# Patient Record
Sex: Male | Born: 1985 | Race: White | Hispanic: No | Marital: Single | Smoking: Heavy tobacco smoker
Health system: Southern US, Community
[De-identification: ages and names within clinical notes are randomized; demographics above are authoritative.]

## PROBLEM LIST (undated history)

## (undated) DIAGNOSIS — F1110 Opioid abuse, uncomplicated: Secondary | ICD-10-CM

---
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
## 2007-05-25 ENCOUNTER — Emergency Department (HOSPITAL_COMMUNITY): Admission: EM | Admit: 2007-05-25 | Discharge: 2007-05-25 | Payer: Self-pay | Admitting: Emergency Medicine

## 2009-02-08 ENCOUNTER — Emergency Department (HOSPITAL_COMMUNITY): Admission: EM | Admit: 2009-02-08 | Discharge: 2009-02-08 | Payer: Self-pay | Admitting: Emergency Medicine

## 2010-05-07 ENCOUNTER — Emergency Department (HOSPITAL_COMMUNITY): Admission: EM | Admit: 2010-05-07 | Discharge: 2010-05-07 | Payer: Self-pay | Admitting: Emergency Medicine

## 2010-05-29 ENCOUNTER — Emergency Department (HOSPITAL_COMMUNITY)
Admission: EM | Admit: 2010-05-29 | Discharge: 2010-05-30 | Disposition: A | Discharge status: Other Acute Inpatient Hospital | Payer: Self-pay | Source: Home / Self Care | Admitting: Emergency Medicine

## 2010-05-30 ENCOUNTER — Inpatient Hospital Stay (HOSPITAL_COMMUNITY)
Admission: AD | Admit: 2010-05-30 | Discharge: 2010-06-02 | Payer: Self-pay | Attending: Psychiatry | Admitting: Psychiatry

## 2010-08-23 LAB — DIFFERENTIAL
Basophils Absolute: 0 10*3/uL (ref 0.0–0.1)
Eosinophils Absolute: 0.3 10*3/uL (ref 0.0–0.7)
Lymphocytes Relative: 24 % (ref 12–46)
Monocytes Absolute: 0.9 10*3/uL (ref 0.1–1.0)
Monocytes Relative: 9 % (ref 3–12)

## 2010-08-23 LAB — BASIC METABOLIC PANEL
CO2: 29 mEq/L (ref 19–32)
Calcium: 9 mg/dL (ref 8.4–10.5)
Creatinine, Ser: 1.32 mg/dL (ref 0.4–1.5)
GFR calc non Af Amer: >60 mL/min (ref >60)
Glucose, Bld: 118 mg/dL — ABNORMAL HIGH (ref 70–99)
Sodium: 142 mEq/L (ref 135–145)

## 2010-08-23 LAB — RAPID URINE DRUG SCREEN, HOSP PERFORMED
Amphetamines: NOT DETECTED
Barbiturates: NOT DETECTED
Opiates: POSITIVE — AB
Tetrahydrocannabinol: POSITIVE — AB

## 2010-08-23 LAB — CBC
MCH: 31.9 pg (ref 26.0–34.0)
Platelets: 224 10*3/uL (ref 150–400)
RBC: 4.58 MIL/uL (ref 4.22–5.81)
WBC: 10 10*3/uL (ref 4.0–10.5)

## 2011-03-21 LAB — CBC
MCV: 92.3
RBC: 5.28
WBC: 17.4 — ABNORMAL HIGH

## 2011-03-21 LAB — URINALYSIS, ROUTINE W REFLEX MICROSCOPIC
Glucose, UA: NEGATIVE
Protein, ur: NEGATIVE
pH: 5.5

## 2011-03-21 LAB — URINE MICROSCOPIC-ADD ON

## 2011-03-21 LAB — DIFFERENTIAL
Basophils Relative: 0
Eosinophils Absolute: 0.1 — ABNORMAL LOW
Lymphs Abs: 2.5
Monocytes Absolute: 1.3 — ABNORMAL HIGH
Monocytes Relative: 7
Neutrophils Relative %: 78 — ABNORMAL HIGH

## 2011-03-21 LAB — BASIC METABOLIC PANEL
Potassium: 3.6
Sodium: 137

## 2011-03-21 LAB — RAPID URINE DRUG SCREEN, HOSP PERFORMED
Cocaine: NOT DETECTED
Tetrahydrocannabinol: POSITIVE — AB

## 2015-01-19 ENCOUNTER — Emergency Department (HOSPITAL_COMMUNITY): Payer: Self-pay

## 2015-01-19 ENCOUNTER — Emergency Department (HOSPITAL_COMMUNITY)
Admission: EM | Admit: 2015-01-19 | Discharge: 2015-01-19 | Disposition: A | Discharge status: Home / Self Care | Payer: Self-pay | Attending: Physician Assistant | Admitting: Physician Assistant

## 2015-01-19 ENCOUNTER — Encounter (HOSPITAL_COMMUNITY): Payer: Self-pay | Admitting: Emergency Medicine

## 2015-01-19 DIAGNOSIS — L259 Unspecified contact dermatitis, unspecified cause: Secondary | ICD-10-CM | POA: Insufficient documentation

## 2015-01-19 DIAGNOSIS — L309 Dermatitis, unspecified: Secondary | ICD-10-CM

## 2015-01-19 DIAGNOSIS — Z720 Tobacco use: Secondary | ICD-10-CM | POA: Insufficient documentation

## 2015-01-19 MED ORDER — LORATADINE 10 MG PO TABS: 10.0000 mg | ORAL_TABLET | Freq: Every day | ORAL | Status: DC

## 2015-01-19 MED ORDER — PREDNISONE 10 MG PO TABS: 20.0000 mg | ORAL_TABLET | Freq: Two times a day (BID) | ORAL | Status: DC

## 2015-01-19 MED ORDER — HYDROXYZINE HCL 25 MG PO TABS: 25.0000 mg | ORAL_TABLET | Freq: Four times a day (QID) | ORAL | Status: DC

## 2015-01-19 MED ORDER — FAMOTIDINE 20 MG PO TABS: 20.0000 mg | ORAL_TABLET | Freq: Two times a day (BID) | ORAL | Status: DC

## 2015-01-19 NOTE — Discharge Instructions (Signed)
Follow up with Dr. Margo Aye if symptoms persist. When you call his number ask for an appointment in the [place] office.

## 2015-01-19 NOTE — ED Notes (Signed)
Pt states that for past 3 weeks has been dealing with a rash--- States that it is all around his groin area

## 2015-01-19 NOTE — ED Provider Notes (Signed)
CSN: [id number]     Arrival date & time 01/19/15  1158 History  This chart was scribed for non-physician practitioner, Kerrie Buffalo, NP, working with Abelino Derrick, MD, by Ronney Lion, ED Scribe. This patient was seen in room APFT21/APFT21 and the patient's care was started at 1:45 PM.    Chief Complaint  Patient presents with  . Rash   Patient is a 29 y.o. male presenting with rash. The history is provided by the patient. No language interpreter was used.  Rash Location:  Ano-genital Ano-genital rash location:  Groin, penis and scrotum Quality: itchiness and painful   Pain details:    Severity:  Moderate   Onset quality:  Gradual   Duration:  3 weeks   Timing:  Constant   Progression:  Worsening (due to scratching) Severity:  Moderate Onset quality:  Gradual Duration:  3 weeks Timing:  Constant Progression:  Worsening Chronicity:  Recurrent Context: plant contact   Relieved by:  None tried Worsened by:  Nothing tried Ineffective treatments:  Anti-itch cream, topical steroids and OTC analgesics  HPI Comments: Wesley Stephens is a 29 y.o. male who presents to the Emergency Department complaining of a constant, pruritic, painful rash on his groin that began 3 weeks ago after coming into contact with poison oak at work. 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 He reports doing yard work and having his hand on poison Ivy and then having to use the bathroom outside. So he could have gotten pollen from his hands on his genital area causing the rash.  Patient has been applying calamine and hydrocortisone to the area, and used ibuprofen with no relief. He denies any recent changes in detergent, soaps, or cosmetics. He denies rash on any other area of his body. Patient states he first had a rash on his leg 4 years ago due  to poison oak that re-appears every year in the summer. He had a rash on his leg this year that was resolved after applying Clorox to dry up the rash. He denies taking any Benadryl.  History reviewed. No pertinent past medical history. History reviewed. No pertinent past surgical history. History reviewed. No pertinent family history. Social History  Substance Use Topics  . Smoking status: Light Tobacco Smoker  . Smokeless tobacco: Never Used  . Alcohol Use: No    Review of Systems  Skin: Positive for rash.  All other systems reviewed and are negative.  Allergies  Review of patient's allergies indicates no known allergies.  Home Medications   Prior to Admission medications   Medication Sig Start Date End Date Taking? Authorizing Provider  famotidine (PEPCID) 20 MG tablet Take 1 tablet (20 mg total) by mouth 2 (two) times daily. 01/19/15   Charl Wellen Orlene Och, NP  hydrOXYzine (ATARAX/VISTARIL) 25 MG tablet Take 1 tablet (25 mg total) by mouth every 6 (six) hours. 01/19/15   Ladaija Dimino Orlene Och, NP  loratadine (CLARITIN) 10 MG tablet Take 1 tablet (10 mg total) by mouth daily. 01/19/15   Shahzain Kiester Orlene Och, NP  predniSONE (DELTASONE) 10 MG tablet Take 2 tablets (20 mg total) by mouth 2 (two) times daily with a meal. 01/19/15   Iyan Flett Orlene Och, NP   BP 114/64 mmHg  Pulse 64  Temp(Src) 97.7 F (36.5 C) (Oral)  Resp 16  Ht  5' 10.5" (1.791 m)  Wt 185 lb (83.915 kg)  BMI 26.16 kg/m2  SpO2 100% Physical Exam  Constitutional: He is oriented to person, place, and time. He appears well-developed and well-nourished. No distress.  HENT:  Head: Normocephalic and atraumatic.  Eyes: Conjunctivae and EOM are normal.  Neck: Neck supple. No tracheal deviation present.  Cardiovascular: Normal rate.   Pulmonary/Chest: Effort normal. No respiratory distress.  Abdominal:  Small red raised areas noted to the the pubic area. No red streaking or signs of infection.   Genitourinary:  Head of penis with rash and itching.    Musculoskeletal: Normal range of motion.  Neurological: He is alert and oriented to person, place, and time.  Skin: Skin is warm and dry.  Psychiatric: He has a normal mood and affect. His behavior is normal.  Nursing note and vitals reviewed.   ED Course  Procedures (including critical care time)  DIAGNOSTIC STUDIES: Oxygen Saturation is 100% on RA, normal by my interpretation.    COORDINATION OF CARE: 1:51 PM - Suspect allergic reaction. Discussed treatment plan with pt at bedside which includes Rx steroid medications, and pt agreed to plan.   MDM  29 y.o. male with rash and itching after working in poison ivy while doing yard work. Will treat with Prednisone, Claritin and Atarax. He will follow up with dermatology if symptoms persist. Stable for d/c without swelling or signs of infection.   Final diagnoses:  Dermatitis    I personally performed the services described in this documentation, which was scribed in my presence. The recorded information has been reviewed and is accurate.     [facility] Orlene Och, NP 01/21/15 1423  Courteney Randall An, MD 01/24/15 [phone]

## 2015-11-09 ENCOUNTER — Emergency Department (HOSPITAL_COMMUNITY)
Admission: EM | Admit: 2015-11-09 | Discharge: 2015-11-09 | Disposition: A | Discharge status: Home / Self Care | Payer: Self-pay | Attending: Emergency Medicine | Admitting: Emergency Medicine

## 2015-11-09 ENCOUNTER — Encounter (HOSPITAL_COMMUNITY): Payer: Self-pay | Admitting: Emergency Medicine

## 2015-11-09 DIAGNOSIS — F17210 Nicotine dependence, cigarettes, uncomplicated: Secondary | ICD-10-CM | POA: Insufficient documentation

## 2015-11-09 DIAGNOSIS — F1110 Opioid abuse, uncomplicated: Secondary | ICD-10-CM | POA: Insufficient documentation

## 2015-11-09 HISTORY — DX: Opioid abuse, uncomplicated: F11.10

## 2015-11-09 LAB — BASIC METABOLIC PANEL
Anion gap: 6 (ref 5–15)
BUN: 14 mg/dL (ref 6–20)
CO2: 24 mmol/L (ref 22–32)
Calcium: 9 mg/dL (ref 8.9–10.3)
Chloride: 107 mmol/L (ref 101–111)
Creatinine, Ser: 0.97 mg/dL (ref 0.61–1.24)
GFR calc Af Amer: >60 mL/min (ref >60)
GFR calc non Af Amer: >60 mL/min (ref >60)
Glucose, Bld: 111 mg/dL — ABNORMAL HIGH (ref 65–99)
Potassium: 3.7 mmol/L (ref 3.5–5.1)
Sodium: 137 mmol/L (ref 135–145)

## 2015-11-09 LAB — ETHANOL: Alcohol, Ethyl (B): <5 mg/dL (ref <5)

## 2015-11-09 LAB — CBC WITH DIFFERENTIAL/PLATELET
BASOS ABS: 0.1 10*3/uL (ref 0.0–0.1)
BASOS PCT: 1 %
EOS ABS: 0.1 10*3/uL (ref 0.0–0.7)
EOS PCT: 2 %
HCT: 42.4 % (ref 39.0–52.0)
Hemoglobin: 14.5 g/dL (ref 13.0–17.0)
Lymphocytes Relative: 19 %
Lymphs Abs: 1.3 10*3/uL (ref 0.7–4.0)
MCH: 30.3 pg (ref 26.0–34.0)
MCHC: 34.2 g/dL (ref 30.0–36.0)
MCV: 88.7 fL (ref 78.0–100.0)
MONO ABS: 0.7 10*3/uL (ref 0.1–1.0)
MONOS PCT: 10 %
Neutro Abs: 4.7 10*3/uL (ref 1.7–7.7)
Neutrophils Relative %: 69 %
PLATELETS: 241 10*3/uL (ref 150–400)
RBC: 4.78 MIL/uL (ref 4.22–5.81)
RDW: 12.3 % (ref 11.5–15.5)
WBC: 6.8 10*3/uL (ref 4.0–10.5)

## 2015-11-09 LAB — RAPID URINE DRUG SCREEN, HOSP PERFORMED
Amphetamines: NOT DETECTED
Barbiturates: NOT DETECTED
Benzodiazepines: POSITIVE — AB
Cocaine: NOT DETECTED
Opiates: NOT DETECTED
Tetrahydrocannabinol: POSITIVE — AB

## 2015-11-09 LAB — URINALYSIS, ROUTINE W REFLEX MICROSCOPIC
Bilirubin Urine: NEGATIVE
Glucose, UA: NEGATIVE mg/dL
Hgb urine dipstick: NEGATIVE
Ketones, ur: NEGATIVE mg/dL
Leukocytes, UA: NEGATIVE
Nitrite: NEGATIVE
Protein, ur: NEGATIVE mg/dL
Specific Gravity, Urine: 1.02 (ref 1.005–1.030)
pH: 6 (ref 5.0–8.0)

## 2015-11-09 LAB — ACETAMINOPHEN LEVEL: Acetaminophen (Tylenol), Serum: <10 ug/mL — ABNORMAL LOW (ref 10–30)

## 2015-11-09 NOTE — Discharge Instructions (Signed)
Community Resource Guide Outpatient Counseling/Substance Abuse Adult °The [employer]’s “211” is a great source of information about community services available.  Access by dialing 2-1-1 from anywhere in [place], or by website -  www.nc211.org.  ° °Other Local Resources (Updated 06/2015) ° °Crisis Hotlines °  °Services  ° °  °Area Served  °[facility] • Crisis Hotline, available 24 hours a day, 7 days a week: 800-939-5911 [place], [place]  ° [facility] • Crisis Hotline, available 24 hours a day, 7 days a week: 866-275-9552 [place], [place]  °[facility] • Suicide Prevention Hotline, available 24 hours a day, 7 days a week: 800-273-8255 [place], [place]  °[facility] ° • Crisis Hotline, available 24 hours a day, 7 days a week: 336-676-6840 [place], [place] °  °[facility] • Crisis Hotline, available 24 hours a day, 7 days a week: 800-256-2452 All °  °[facility] • Crisis Hotline, available 24 hours a day, 7 days a week: 877-626-1772 [place]  ° °Other Local Resources (Updated 06/2015) ° °Outpatient Counseling/ Substance Abuse Programs  °Services  ° °  °Address and Phone Number  °[facility] ° • Options include Individual counseling, group counseling, intensive outpatient program (several hours a day, several days a week) °• Offers depression assessments °• Provides methadone maintenance program 336-333-6860 °[address] °[place], [place] [address] °  °[facility] ° • Offers partial hospitalization/day treatment and DUI/DWI programs °• Accepts Medicare, private insurance 336-299-4655 °[address] °[place], [place] [address]  °[facility] ° ° • Services include intensive outpatient program (several hours a day, several days a week), outpatient treatment, DUI/DWI services, family education °• Also has some services specifically for Veterans °• Offers transitional housing   [phone] °[address] °[place], [place] [address] °  °  ° [facility] • Accepts Medicare, private pay, and private insurance 336-272-0855 °[address] °[place], [place] [address]  °[facility] • Services include individual counseling, substance abuse intensive outpatient program (several hours a day, several days a week), day treatment °• Accepts Medicare, Medicaid, private insurance 336-271-5888 °[address] °[place], [place] [address]  °[facility] ° • Offers substance abuse intensive outpatient program (several hours a day, several days a week), partial hospitalization program 336-832-9800 °[address] °[place], [place] [address] ° °336-349-4454 °[address] °[place], [place] [address] ° °336-386-3795 °[address] °[place], [place] [address] ° °[phone] °[address] °[place], [place] [address]  °[facility] • Individual counseling only °• Accepts private insurance only 336-292-1510 °[address] °[place], [place] [address]  °[facility] • Methadone maintenance program 800-805-6989 °[address] °[place], [place] [address]  °[facility] • [facility]-In Clinic providing substance abuse and mental health counseling °• Accepts Medicaid, Medicare, private insurance °• Offers sliding scale for uninsured 336-342-8316 °[address] °[place], [place]   °[facility] • Offers individual counseling, and intensive in-home services 336-347-7415 °[address] °[place], [place] [address]  °[facility] • Offers individual counseling, family counseling, group therapy, domestic violence counseling, consumer credit counseling °• Accepts Medicare, Medicaid, private insurance °• Offers sliding scale for uninsured 336-387-6161 °[address] °, [place] [address] ° °336-889-6161 °[place], [address] °[place],  [address]  °[facility] • Offers individual, family   and group counseling °• 3 locations - [place], [place], and [place] ° 336-899-8800 ° °[address] °[place], [place] [address] ° °[address] °[place], [place] [address] ° °[address] °[place], [place] [address]  °[facility]  ° • Offers psychiatric assessment, 8-week Intensive Outpatient Program (several hours a day, several times a week, daytime or evenings), early recovery group, family Program, medication management °• Private pay or private insurance only 336 -[phone], or  °800-659-3381 °[address] °[place], [place] [address]  °[facility] • Offers individual, couples and family counseling °• Accepts Medicaid, private insurance, and sliding scale for uninsured 336-542-2076 °[address] °[place], [place] [address]  °David Fuller, MD • Individual counseling °• Private insurance 336-852-4051 °[address] °[place], [place] [address]  °[facility] ° • Offers assessment, substance abuse treatment, and behavioral health treatment [phone] °[address] °[place], [place] [address]  °[facility] • Individual counseling °• Accepts private insurance 336-272-1972 °[address] °[place], [place] [address]  °[facility] • Individual counseling °• Accepts Medicare, private insurance 336-547-1574 °[address] °[place], [place] [address]  °[facility]  ° • Offers intensive outpatient program (several hours a day, several times a week) °• Private pay, private insurance [phone] °[address] °[place], [place]  °[facility] • Individual counseling °• Medicare, private insurance 336-505-9494 °[address] °[place], [place] [address]  °[facility]  ° • Offers intensive outpatient program (several hours a day, several times a week) and partial hospitalization  program 336-794-3550 °[address] °[place], [place] [address]  °Parrish McKinney, MD • Individual counseling 336-282-1251 °[address] °[place], [place] [address]  °[facility] • Offers Christian counseling to individuals, couples, and families °• Accepts Medicare and private insurance; offers sliding scale for uninsured 336-288-1484 °[address] °[place], [place] [address]  °[facility] • Christian counseling 336-542-2060 °[address] °[place], [place] [address]  °[facility] ° • Offers crisis counseling, individual counseling, group therapy, in-home therapy, domestic violence services, day treatment, DWI services, Community Support Team (CST), Assertive Community Treatment Team (ACTT), substance abuse Intensive Outpatient Program (several hours a day, several times a week) °• 2 locations - [place] and [place] [phone] °[address] °[place], [place] [address] ° °336-694-1777 °[address] °[place], [place] [address]  °[facility]  ° ° • Individual counseling and group therapy °• Accepts private insurance, Medicare, Medicaid 336-379-7146 °[address] °[place], [place]  °[facility] • Offers individual and family counseling °• Offers LGBTQ services °• Accepts private insurance and private pay 336-288-9190 °[address] °[place], [place] [address]  °[facility]  ° • Offers individual counseling, group therapy, and outpatient detox °• Accepts private insurance 336-389-1413 °[address] °[place], [place]  °[facility] • Individual counseling °• Accepts Medicare, private insurance 336-632-3505 °[address] °[place], [place] [address]  °[facility] • Individual counseling °• Accepts Medicare, private insurance 336-570-0104 °[address] °[place], [place] [address]  °[facility] ° • Offers substance abuse  Intensive Outpatient Program (several hours a day, several times a week) [phone], or °[phone] °[place],   ° °

## 2015-11-09 NOTE — ED Notes (Signed)
Pt attempting to provide urine sample. EDP reported pt requesting to leave.EDP printed out discharge instructions with outpatient resources. Will be reviewed prior to discharge.

## 2015-11-09 NOTE — ED Provider Notes (Signed)
CSN: [id number]     Arrival date & time 11/09/15  1627 History   First MD Initiated Contact with Patient 11/09/15 1652     Chief Complaint  Patient presents with  . V70.1      HPI  Pt was seen at 1710. Per pt, c/o gradual onset and persistence of constant opiate abuse for the past 10 years. Pt states his LD was 2 days ago and he "feels like I'm in withdrawal now." Endorses "pains all over." Requesting detox programs. Denies SI, no HI, no hallucinations.    Past Medical History  Diagnosis Date  . Narcotic abuse    History reviewed. No pertinent past surgical history.  Social History  Substance Use Topics  . Smoking status: Heavy Tobacco Smoker -- 0.50 packs/day    Types: Cigarettes  . Smokeless tobacco: Never Used  . Alcohol Use: No    Review of Systems ROS: Statement: All systems negative except as marked or noted in the HPI; Constitutional: Negative for fever and chills. +body aches all over.; ; Eyes: Negative for eye pain, redness and discharge. ; ; ENMT: Negative for ear pain, hoarseness, nasal congestion, sinus pressure and sore throat. ; ; Cardiovascular: Negative for chest pain, palpitations, diaphoresis, dyspnea and peripheral edema. ; ; Respiratory: Negative for cough, wheezing and stridor. ; ; Gastrointestinal: Negative for nausea, vomiting, diarrhea, abdominal pain, blood in stool, hematemesis, jaundice and rectal bleeding. . ; ; Genitourinary: Negative for dysuria, flank pain and hematuria. ; ; Musculoskeletal: Negative for back pain and neck pain. Negative for swelling and trauma.; ; Skin: Negative for pruritus, rash, abrasions, blisters, bruising and skin lesion.; ; Neuro: Negative for headache, lightheadedness and neck stiffness. Negative for weakness, altered level of consciousness, altered mental status, extremity weakness, paresthesias, involuntary movement, seizure and syncope.; Psych:  No SI, no SA, no HI, no hallucinations.    Allergies  Review of patient's  allergies indicates no known allergies.  Home Medications   Prior to Admission medications   Medication Sig Start Date End Date Taking? Authorizing Provider  famotidine (PEPCID) 20 MG tablet Take 1 tablet (20 mg total) by mouth 2 (two) times daily. 01/19/15   [place] Orlene Och, NP  hydrOXYzine (ATARAX/VISTARIL) 25 MG tablet Take 1 tablet (25 mg total) by mouth every 6 (six) hours. 01/19/15   Hope Orlene Och, NP  loratadine (CLARITIN) 10 MG tablet Take 1 tablet (10 mg total) by mouth daily. 01/19/15   Hope Orlene Och, NP  predniSONE (DELTASONE) 10 MG tablet Take 2 tablets (20 mg total) by mouth 2 (two) times daily with a meal. 01/19/15   Hope Orlene Och, NP   BP 121/71 mmHg  Pulse 104  Temp(Src) 98.9 F (37.2 C) (Temporal)  Resp 20  Ht  (1.778 m)  Wt 180 lb (81.647 kg)  BMI 25.83 kg/m2  SpO2 99% Physical Exam 1715: Physical examination:  Nursing notes reviewed; Vital signs and O2 SAT reviewed;  Constitutional: Well developed, Well nourished, Well hydrated, In no acute distress; Head:  Normocephalic, atraumatic; Eyes: EOMI, PERRL, No scleral icterus; ENMT: Mouth and pharynx normal, Mucous membranes moist; Neck: Supple, Full range of motion; Cardiovascular: Regular rate and rhythm; Respiratory: Breath sounds clear, No wheezes.  Speaking full sentences with ease, Normal respiratory effort/excursion; Chest: No deformity, Movement normal; Abdomen: Nondistended; Extremities: No deformity. No tremor..; Neuro: AA&Ox3, Major CN grossly intact.  Speech clear. No gross focal motor deficits in extremities. Climbs on and off stretcher easily by himself. Gait steady.; Skin: Color  normal, Warm, Dry.; Psych:  Denies SI/HI, no psychosis.    ED Course  Procedures (including critical care time) Labs Review   Imaging Review  I have personally reviewed and evaluated these images and lab results as part of my medical decision-making.   EKG Interpretation None      MDM  MDM Reviewed: previous chart, nursing note  and vitals Reviewed previous: labs Interpretation: labs      Results for orders placed or performed during the hospital encounter of 11/09/15  Urine rapid drug screen (hosp performed)  Result Value Ref Range   Opiates NONE DETECTED NONE DETECTED   Cocaine NONE DETECTED NONE DETECTED   Benzodiazepines POSITIVE (A) NONE DETECTED   Amphetamines NONE DETECTED NONE DETECTED   Tetrahydrocannabinol POSITIVE (A) NONE DETECTED   Barbiturates NONE DETECTED NONE DETECTED  CBC with Differential  Result Value Ref Range   WBC 6.8 4.0 - 10.5 K/uL   RBC 4.78 4.22 - 5.81 MIL/uL   Hemoglobin 14.5 13.0 - 17.0 g/dL   HCT 21.342.4 08.639.0 - 57.852.0 %   MCV 88.7 78.0 - 100.0 fL   MCH 30.3 26.0 - 34.0 pg   MCHC 34.2 30.0 - 36.0 g/dL   RDW 46.912.3 62.911.5 - 52.815.5 %   Platelets 241 150 - 400 K/uL   Neutrophils Relative % 69 %   Neutro Abs 4.7 1.7 - 7.7 K/uL   Lymphocytes Relative 19 %   Lymphs Abs 1.3 0.7 - 4.0 K/uL   Monocytes Relative 10 %   Monocytes Absolute 0.7 0.1 - 1.0 K/uL   Eosinophils Relative 2 %   Eosinophils Absolute 0.1 0.0 - 0.7 K/uL   Basophils Relative 1 %   Basophils Absolute 0.1 0.0 - 0.1 K/uL  Basic metabolic panel  Result Value Ref Range   Sodium 137 135 - 145 mmol/L   Potassium 3.7 3.5 - 5.1 mmol/L   Chloride 107 101 - 111 mmol/L   CO2 24 22 - 32 mmol/L   Glucose, Bld 111 (H) 65 - 99 mg/dL   BUN 14 6 - 20 mg/dL   Creatinine, Ser 4.130.97 0.61 - 1.24 mg/dL   Calcium 9.0 8.9 - 24.410.3 mg/dL   GFR calc non Af Amer >60 >60 mL/min   GFR calc Af Amer >60 >60 mL/min   Anion gap 6 5 - 15  Acetaminophen level  Result Value Ref Range   Acetaminophen (Tylenol), Serum <10 (L) 10 - 30 ug/mL  Ethanol  Result Value Ref Range   Alcohol, Ethyl (B) <5 <5 mg/dL  Urinalysis, Routine w reflex microscopic (not [facility])  Result Value Ref Range   Color, Urine YELLOW YELLOW   APPearance CLEAR CLEAR   Specific Gravity, Urine 1.020 1.005 - 1.030   pH 6.0 5.0 - 8.0   Glucose, UA NEGATIVE NEGATIVE mg/dL   Hgb  urine dipstick NEGATIVE NEGATIVE   Bilirubin Urine NEGATIVE NEGATIVE   Ketones, ur NEGATIVE NEGATIVE mg/dL   Protein, ur NEGATIVE NEGATIVE mg/dL   Nitrite NEGATIVE NEGATIVE   Leukocytes, UA NEGATIVE NEGATIVE    1710:  Denies SI/HI, no overt psychosis. Offered to tx symptoms with multiple meds; pt refuses all that I have to offer (non-narcotic and non-benzo meds), stating "they don't work." States he "is just [place]." States he "wants a methadone or suboxone program." Offered to print out outpatient resources before he leaves; agreeable. Left the ED stable.     Samuel JesterKathleen Edgar Corrigan, DO 11/11/15 2109

## 2015-11-09 NOTE — ED Notes (Signed)
Pt states he has been using opiates heavily for 10 years.  States his last use was 2 days ago.  C/o pain all over. Pt states he generally takes or snorts pills.

## 2021-07-06 ENCOUNTER — Emergency Department (HOSPITAL_COMMUNITY)
Admission: EM | Admit: 2021-07-06 | Discharge: 2021-07-06 | Disposition: A | Discharge status: Home / Self Care | Payer: Self-pay | Attending: Emergency Medicine | Admitting: Emergency Medicine

## 2021-07-06 ENCOUNTER — Emergency Department (HOSPITAL_COMMUNITY): Payer: Self-pay

## 2021-07-06 ENCOUNTER — Encounter (HOSPITAL_COMMUNITY): Payer: Self-pay

## 2021-07-06 ENCOUNTER — Other Ambulatory Visit: Payer: Self-pay

## 2021-07-06 DIAGNOSIS — X500XXA Overexertion from strenuous movement or load, initial encounter: Secondary | ICD-10-CM | POA: Insufficient documentation

## 2021-07-06 DIAGNOSIS — S43005A Unspecified dislocation of left shoulder joint, initial encounter: Secondary | ICD-10-CM | POA: Insufficient documentation

## 2021-07-06 MED ORDER — PROPOFOL 10 MG/ML IV BOLUS
INTRAVENOUS | Status: AC | PRN
Start: 2021-07-06 — End: 2021-07-06
  Administered 2021-07-06 (×3): 40 mg via INTRAVENOUS

## 2021-07-06 MED ORDER — PROPOFOL 10 MG/ML IV BOLUS
1.0000 mg/kg | Freq: Once | INTRAVENOUS | Status: AC
  Administered 2021-07-06: 23:00:00 79.4 mg via INTRAVENOUS
  Filled 2021-07-06: qty 20

## 2021-07-06 MED ORDER — PROPOFOL 10 MG/ML IV BOLUS
INTRAVENOUS | Status: AC
  Filled 2021-07-06: qty 20

## 2021-07-06 MED ORDER — HYDROMORPHONE HCL 1 MG/ML IJ SOLN
1.0000 mg | Freq: Once | INTRAMUSCULAR | Status: AC
  Administered 2021-07-06: 22:00:00 1 mg via INTRAMUSCULAR
  Filled 2021-07-06: qty 1

## 2021-07-06 NOTE — ED Triage Notes (Signed)
POV from home with family. Cc of left shoulder dislocation. 3rd time in 1 month. Normally it goes in  [place]. This time it did not. Said he was stretching in bed when it popped out. Torn muscle working out and has had problems since.

## 2021-07-06 NOTE — Progress Notes (Addendum)
Assisted in conscious sedation for patient's shoulder dislocation.  Patient's ETCO2 stayed within normal limits during procedure.  Patient able to follow commands now and talking with staff.  Patient on RA with sat of 98%.

## 2021-07-06 NOTE — ED Provider Notes (Signed)
Reduction of dislocation  Date/Time: 07/06/2021 11:07 PM Performed by: Olene Floss, PA-C Authorized by: Olene Floss, PA-C  Consent: Verbal consent obtained. Written consent obtained. Risks and benefits: risks, benefits and alternatives were discussed Consent given by: patient Patient understanding: patient states understanding of the procedure being performed Patient consent: the patient's understanding of the procedure matches consent given Procedure consent: procedure consent matches procedure scheduled Relevant documents: relevant documents present and verified Test results: test results available and properly labeled Imaging studies: imaging studies available Patient identity confirmed: verbally with patient and arm band Time out: Immediately prior to procedure a "time out" was called to verify the correct patient, procedure, equipment, support staff and site/side marked as required. Local anesthesia used: no  Anesthesia: Local anesthesia used: no  Sedation: Patient sedated: yes Sedation type: moderate (conscious) sedation Sedatives: propofol Analgesia: hydromorphone Sedation start date/time: 07/06/2021 10:35 PM Sedation end date/time: 07/06/2021 10:47 PM Vitals: Vital signs were monitored during sedation.  Patient tolerance: patient tolerated the procedure well with no immediate complications   I performed this reduction with the assistance of my attending physician Dr. Jeraldine Loots.  Please refer to his full H&P, and MDM for further information on this patient's care.   [place] 07/06/21 2310    Gerhard Munch, MD 07/06/21 2348

## 2021-07-06 NOTE — ED Provider Notes (Signed)
[facility] EMERGENCY DEPARTMENT Provider Note   CSN: [id number] Arrival date & time: 07/06/21  2148     History  Chief Complaint  Patient presents with   Shoulder Injury    Dislocation    Wesley Stephens is a 36 y.o. male.  HPI Patient presents with cute onset shoulder pain.  History is somewhat difficult as the patient is in extreme pain.  However, it seems as though he has had several spontaneous dislocations and reductions over the past month after initially injuring his shoulder while weightlifting.  Tonight pain began about 1 hour prior to ED arrival, since that time has been severe, worse with motion.  No other injuries, complaints, fall.  No medication taken for relief.    Home Medications Prior to Admission medications   Medication Sig Start Date End Date Taking? Authorizing Provider  famotidine (PEPCID) 20 MG tablet Take 1 tablet (20 mg total) by mouth 2 (two) times daily. 01/19/15   Janne Napoleon, NP  hydrOXYzine (ATARAX/VISTARIL) 25 MG tablet Take 1 tablet (25 mg total) by mouth every 6 (six) hours. 01/19/15   Janne Napoleon, NP  loratadine (CLARITIN) 10 MG tablet Take 1 tablet (10 mg total) by mouth daily. 01/19/15   Janne Napoleon, NP  predniSONE (DELTASONE) 10 MG tablet Take 2 tablets (20 mg total) by mouth 2 (two) times daily with a meal. 01/19/15   Janne Napoleon, NP      Allergies    Patient has no known allergies.    Review of Systems   Review of Systems  Constitutional:        Per HPI, otherwise negative  HENT:         Per HPI, otherwise negative  Respiratory:         Per HPI, otherwise negative  Cardiovascular:        Per HPI, otherwise negative  Gastrointestinal:  Negative for vomiting.  Endocrine:       Negative aside from HPI  Genitourinary:        Neg aside from HPI   Musculoskeletal:        Per HPI, otherwise negative  Skin: Negative.   Neurological:  Negative for syncope.   Physical Exam Updated Vital Signs BP 125/84    Pulse (!) 101     Temp 98.1 F (36.7 C)    Resp 17    Ht 5\' 11"  (1.803 m)    Wt 79.4 kg    SpO2 98%    BMI 24.41 kg/m  Physical Exam Vitals and nursing note reviewed.  Constitutional:      General: He is not in acute distress.    Appearance: He is well-developed.  HENT:     Head: Normocephalic and atraumatic.  Eyes:     Conjunctiva/sclera: Conjunctivae normal.  Cardiovascular:     Rate and Rhythm: Normal rate and regular rhythm.  Pulmonary:     Effort: Pulmonary effort is normal. No respiratory distress.     Breath sounds: No stridor.  Abdominal:     General: There is no distension.  Musculoskeletal:       Arms:  Skin:    General: Skin is warm and dry.  Neurological:     Mental Status: He is alert and oriented to person, place, and time.    ED Results / Procedures / Treatments   Labs (all labs ordered are listed, but only abnormal results are displayed) Labs Reviewed - No data to display  EKG None  Radiology DG Shoulder Left Portable  Result Date: 07/06/2021 CLINICAL DATA:  Status post relocation of the left shoulder. EXAM: LEFT SHOULDER COMPARISON:  Earlier radiograph dated 07/06/2021. FINDINGS: There is no evidence of fracture or dislocation. There is no evidence of arthropathy or other focal bone abnormality. Soft tissues are unremarkable. IMPRESSION: No acute fracture or dislocation. Electronically Signed   By: Elgie Collard M.D.   On: 07/06/2021 23:08   DG Shoulder Left Portable  Result Date: 07/06/2021 CLINICAL DATA:  Possible shoulder dislocation. EXAM: LEFT SHOULDER COMPARISON:  None. FINDINGS: There appears to be anterior dislocation of the left humeral head. No visible fracture. AC joint is intact. Soft tissues are intact. IMPRESSION: Anterior left shoulder dislocation. Electronically Signed   By: Charlett Nose M.D.   On: 07/06/2021 22:30    Procedures .Sedation  Date/Time: 07/06/2021 11:23 PM Performed by: Gerhard Munch, MD Authorized by: Gerhard Munch, MD    Consent:    Consent obtained:  Verbal   Consent given by:  Patient   Risks discussed:  Inadequate sedation, nausea and vomiting   Alternatives discussed:  Analgesia without sedation Universal protocol:    Procedure explained and questions answered to patient or proxy's satisfaction: yes     Relevant documents present and verified: yes     Test results available: yes     Imaging studies available: yes     Required blood products, implants, devices, and special equipment available: yes     Site/side marked: yes     Immediately prior to procedure, a time out was called: yes     Patient identity confirmed:  Verbally with patient Indications:    Procedure performed:  Dislocation reduction   Procedure necessitating sedation performed by:  Different physician Pre-sedation assessment:    Time since last food or drink:  3   NPO status caution: unable to specify NPO status     ASA classification: class 1 - normal, healthy patient     Mouth opening:  3 or more finger widths   Thyromental distance:  4 finger widths   Mallampati score:  I - soft palate, uvula, fauces, pillars visible   Neck mobility: normal     Pre-sedation assessments completed and reviewed: airway patency, cardiovascular function, hydration status, mental status, nausea/vomiting, pain level, respiratory function and temperature     Pre-sedation assessment completed:  07/06/2021 10:00 PM Immediate pre-procedure details:    Reassessment: Patient reassessed immediately prior to procedure     Reviewed: vital signs and NPO status     Verified: bag valve mask available, emergency equipment available, intubation equipment available, IV patency confirmed, oxygen available, reversal medications available and suction available   Procedure details (see MAR for exact dosages):    Preoxygenation:  Nasal cannula   Sedation:  Propofol   Intended level of sedation: deep   Analgesia:  Hydromorphone   Intra-procedure monitoring:  Blood  pressure monitoring, cardiac monitor, continuous capnometry, continuous pulse oximetry, frequent LOC assessments and frequent vital sign checks   Intra-procedure events: none     Total Provider sedation time (minutes):  23 Post-procedure details:    Post-sedation assessment completed:  07/06/2021 9:05 PM   Attendance: Constant attendance by certified staff until patient recovered     Recovery: Patient returned to pre-procedure baseline     Post-sedation assessments completed and reviewed: airway patency, cardiovascular function, hydration status, mental status, nausea/vomiting, pain level, respiratory function and temperature     Patient is stable for discharge or admission: yes  Procedure completion:  Tolerated well, no immediate complications    Medications Ordered in ED Medications  HYDROmorphone (DILAUDID) injection 1 mg (1 mg Intramuscular Given 07/06/21 2156)  propofol (DIPRIVAN) 10 mg/mL bolus/IV push 79.4 mg (0 mg Intravenous Not Given 07/06/21 2306)  propofol (DIPRIVAN) 10 mg/mL bolus/IV push (40 mg Intravenous Given 07/06/21 2243)    ED Course/ Medical Decision Making/ A&P  Final Clinical Impression(s) / ED Diagnoses Final diagnoses:  Shoulder dislocation, left, initial encounter   After after obtaining consent and reviewing the patient's x-ray, interpretation consistent with shoulder dislocation patient had propofol mediated conscious sedation, with successful reduction, he had repeat x-ray also interpreted by me consistent with this, patient placed on sling by myself, staff, well-tolerated.  He recovered entirely from his conscious sedation.  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  Patient required parenteral narcotics, conscious sedation with successful reduction, as above.  Patient has been challenged in obtaining follow-up care, this was addressed and he was provided additional resources.  No evidence for other acute new findings,  patient discharged in stable condition.   Gerhard MunchLockwood, Anabeth Chilcott, MD 07/06/21 2350

## 2021-07-06 NOTE — Discharge Instructions (Signed)
With your recurrent shoulder dislocation is very importantly follow-up with our orthopedic colleague for appropriate ongoing outpatient management.  Return here for concerning changes in your condition.

## 2022-01-10 ENCOUNTER — Emergency Department (HOSPITAL_COMMUNITY)
Admission: EM | Admit: 2022-01-10 | Discharge: 2022-01-11 | Disposition: A | Discharge status: Home / Self Care | Payer: Self-pay | Attending: Emergency Medicine | Admitting: Emergency Medicine

## 2022-01-10 ENCOUNTER — Emergency Department (HOSPITAL_COMMUNITY): Payer: Self-pay

## 2022-01-10 DIAGNOSIS — R197 Diarrhea, unspecified: Secondary | ICD-10-CM | POA: Insufficient documentation

## 2022-01-10 DIAGNOSIS — R5383 Other fatigue: Secondary | ICD-10-CM | POA: Insufficient documentation

## 2022-01-10 DIAGNOSIS — R112 Nausea with vomiting, unspecified: Secondary | ICD-10-CM | POA: Insufficient documentation

## 2022-01-10 NOTE — ED Provider Triage Note (Signed)
  Emergency Medicine Provider Triage Evaluation Note  MRN:  [id number]  Arrival date & time: 01/10/22    Medically screening exam initiated at 11:42 PM.   CC:   Emesis, Nausea, and Diarrhea   HPI:  Wesley Stephens is a 36 y.o. year-old male presents to the ED with chief complaint of nausea, vomiting, diarrhea.  Reports associated fatigue.  Onset yesterday.  Also concerned that he has a nail stuck in his forearm.  History provided by patient. ROS:  -As included in HPI PE:   Vitals:   01/10/22 2336  BP: 131/89  Pulse: 98  Resp: 18  Temp: 99.2 F (37.3 C)  SpO2: 100%    Non-toxic appearing No respiratory distress  MDM:  Based on signs and symptoms, gastro is highest on my differential. I've ordered labs in triage to expedite lab/diagnostic workup.  Patient was informed that the remainder of the evaluation will be completed by another provider, this initial triage assessment does not replace that evaluation, and the importance of remaining in the ED until their evaluation is complete.    Roxy Horseman, PA-C 01/10/22 2345

## 2022-01-10 NOTE — ED Triage Notes (Signed)
Pt c/o "waves of sickness," NVD, weakness, poor PO onset yesterday morning. Zofran for nausea "after lunch" that has helped. No other sick symptoms Concern r/t "nail" shot into L arm 1.48mos ago, Tdap UTD.

## 2022-01-11 ENCOUNTER — Other Ambulatory Visit: Payer: Self-pay

## 2022-01-11 LAB — CBC WITH DIFFERENTIAL/PLATELET
Abs Immature Granulocytes: 0.01 10*3/uL (ref 0.00–0.07)
Basophils Absolute: 0.1 10*3/uL (ref 0.0–0.1)
Basophils Relative: 1 %
Eosinophils Absolute: 0.4 10*3/uL (ref 0.0–0.5)
Eosinophils Relative: 7 %
HCT: 42.7 % (ref 39.0–52.0)
Hemoglobin: 14.6 g/dL (ref 13.0–17.0)
Immature Granulocytes: 0 %
Lymphocytes Relative: 27 %
Lymphs Abs: 1.6 10*3/uL (ref 0.7–4.0)
MCH: 30.5 pg (ref 26.0–34.0)
MCHC: 34.2 g/dL (ref 30.0–36.0)
MCV: 89.1 fL (ref 80.0–100.0)
Monocytes Absolute: 0.8 10*3/uL (ref 0.1–1.0)
Monocytes Relative: 14 %
Neutro Abs: 2.9 10*3/uL (ref 1.7–7.7)
Neutrophils Relative %: 51 %
Platelets: 247 10*3/uL (ref 150–400)
RBC: 4.79 MIL/uL (ref 4.22–5.81)
RDW: 12 % (ref 11.5–15.5)
WBC: 5.8 10*3/uL (ref 4.0–10.5)
nRBC: 0 % (ref 0.0–0.2)

## 2022-01-11 LAB — COMPREHENSIVE METABOLIC PANEL
ALT: 29 U/L (ref 0–44)
AST: 24 U/L (ref 15–41)
Albumin: 4.3 g/dL (ref 3.5–5.0)
Alkaline Phosphatase: 69 U/L (ref 38–126)
Anion gap: 7 (ref 5–15)
BUN: 14 mg/dL (ref 6–20)
CO2: 31 mmol/L (ref 22–32)
Calcium: 9.8 mg/dL (ref 8.9–10.3)
Chloride: 102 mmol/L (ref 98–111)
Creatinine, Ser: 1.14 mg/dL (ref 0.61–1.24)
GFR, Estimated: >60 mL/min (ref >60)
Glucose, Bld: 109 mg/dL — ABNORMAL HIGH (ref 70–99)
Potassium: 4 mmol/L (ref 3.5–5.1)
Sodium: 140 mmol/L (ref 135–145)
Total Bilirubin: 0.5 mg/dL (ref 0.3–1.2)
Total Protein: 6.8 g/dL (ref 6.5–8.1)

## 2022-01-11 LAB — URINALYSIS, ROUTINE W REFLEX MICROSCOPIC
Bilirubin Urine: NEGATIVE
Glucose, UA: NEGATIVE mg/dL
Hgb urine dipstick: NEGATIVE
Ketones, ur: NEGATIVE mg/dL
Leukocytes,Ua: NEGATIVE
Nitrite: NEGATIVE
Protein, ur: NEGATIVE mg/dL
Specific Gravity, Urine: 1.011 (ref 1.005–1.030)
pH: 7 (ref 5.0–8.0)

## 2022-01-11 LAB — LIPASE, BLOOD: Lipase: 35 U/L (ref 11–51)

## 2022-01-11 MED ORDER — ONDANSETRON 4 MG PO TBDP: 4.0000 mg | ORAL_TABLET | Freq: Three times a day (TID) | ORAL | 0 refills | Status: DC | PRN

## 2022-01-11 NOTE — ED Provider Notes (Signed)
[facility]-EMERGENCY DEPT [facility] Emergency Department Provider Note MRN:  [id number]  Arrival date & time: 01/11/22     Chief Complaint   Emesis, Nausea, and Diarrhea   History of Present Illness   Wesley Stephens is a 36 y.o. year-old male presents to the ED with chief complaint of nausea, vomiting, and diarrhea.  Onset of symptoms was yesterday.  Patient reports associated fatigue.  He denies any fever or chills.  He states that he is concerned that his symptoms are related to an injury he sustained while using a nail gun in which he thinks he shot a nail into his left wrist.  He is uncertain whether the nail is there or not.  He denies any sick contacts.  History provided by patient.   Review of Systems  Pertinent review of systems noted in HPI.    Physical Exam   Vitals:   01/11/22 0310 01/11/22 0427  BP: 127/68 121/82  Pulse: 71 73  Resp: 18 19  Temp:  97.7 F (36.5 C)  SpO2: 100% 100%    CONSTITUTIONAL:  well-appearing, NAD NEURO:  Alert and oriented x 3, CN 3-12 grossly intact EYES:  eyes equal and reactive ENT/NECK:  Supple, no stridor  CARDIO:  normal rate, regular rhythm, appears well-perfused  PULM:  No respiratory distress,  GI/GU:  non-distended,  MSK/SPINE:  No gross deformities, no edema, moves all extremities  SKIN:  no rash, atraumatic   *Additional and/or pertinent findings included in MDM below  Diagnostic and Interventional Summary    EKG Interpretation  Date/Time:    Ventricular Rate:    PR Interval:    QRS Duration:   QT Interval:    QTC Calculation:   R Axis:     Text Interpretation:         Labs Reviewed  COMPREHENSIVE METABOLIC PANEL - Abnormal; Notable for the following components:      Result Value   Glucose, Bld 109 (*)    All other components within normal limits  URINALYSIS, ROUTINE W REFLEX MICROSCOPIC - Abnormal; Notable for the following components:   APPearance HAZY (*)    All other components within normal  limits  LIPASE, BLOOD  CBC WITH DIFFERENTIAL/PLATELET    DG Forearm Left  Final Result      Medications - No data to display   Procedures  /  Critical Care Procedures  ED Course and Medical Decision Making  I have reviewed the triage vital signs, the nursing notes, and pertinent available records from the EMR.  Social Determinants Affecting Complexity of Care: Patient has no clinically significant social determinants affecting this chief complaint..   ED Course:   Patient here with n/v/d.  Top differential diagnoses include gastroenteritis, colitis, obstruction. Medical Decision Making Patient here with nausea, vomiting, and diarrhea.  Symptom onset was yesterday.  Vital signs are stable.  He is concerned that he might have a nail in his left forearm from a recent carpentry accident.  Plain films of the left forearm reveal no foreign body.  He has been sleeping in the waiting room for the past several hours.  Laboratory work-up is reassuring.  Amount and/or Complexity of Data Reviewed Labs: ordered.    Details: No significant electrolyte derangement or leukocytosis Radiology: ordered and independent interpretation performed.    Details: Negative for foreign body     Consultants: No consultations were needed in caring for this patient.   Treatment and Plan: Emergency department workup does not suggest an emergent  condition requiring admission or immediate intervention beyond  what has been performed at this time. The patient is safe for discharge and has  been instructed to return immediately for worsening symptoms, change in  symptoms or any other concerns    Final Clinical Impressions(s) / ED Diagnoses     ICD-10-CM   1. Nausea vomiting and diarrhea  R11.2    R19.7       ED Discharge Orders          Ordered    ondansetron (ZOFRAN-ODT) 4 MG disintegrating tablet  Every 8 hours PRN        01/11/22 0423              Discharge Instructions Discussed  with and Provided to Patient:   Discharge Instructions   None      Roxy Horseman, PA-C 01/11/22 0444    Tilden Fossa, MD 01/11/22 [phone]

## 2022-01-11 NOTE — ED Notes (Addendum)
Pt discharged home. Discharge instructions provided and pt verbalized understanding. Prescription provided x1 and pt educated on where to pick up medication. AOX4. Respirations even and unlabored at room air with no distress noted. Ambulatory to ED lobby with strong and steady  gait.

## 2022-02-12 ENCOUNTER — Emergency Department (HOSPITAL_COMMUNITY): Payer: Self-pay

## 2022-02-12 ENCOUNTER — Other Ambulatory Visit: Payer: Self-pay

## 2022-02-12 ENCOUNTER — Encounter (HOSPITAL_COMMUNITY): Payer: Self-pay

## 2022-02-12 ENCOUNTER — Emergency Department (HOSPITAL_COMMUNITY)
Admission: EM | Admit: 2022-02-12 | Discharge: 2022-02-12 | Disposition: A | Discharge status: Home / Self Care | Payer: Self-pay | Attending: Emergency Medicine | Admitting: Emergency Medicine

## 2022-02-12 DIAGNOSIS — S43005A Unspecified dislocation of left shoulder joint, initial encounter: Secondary | ICD-10-CM | POA: Insufficient documentation

## 2022-02-12 DIAGNOSIS — X58XXXA Exposure to other specified factors, initial encounter: Secondary | ICD-10-CM | POA: Insufficient documentation

## 2022-02-12 MED ORDER — HYDROMORPHONE HCL 1 MG/ML IJ SOLN
1.0000 mg | Freq: Once | INTRAMUSCULAR | Status: AC
Start: 2022-02-12 — End: 2022-02-12
  Administered 2022-02-12: 1 mg via INTRAVENOUS
  Filled 2022-02-12: qty 1

## 2022-02-12 MED ORDER — FENTANYL CITRATE PF 50 MCG/ML IJ SOSY
PREFILLED_SYRINGE | INTRAMUSCULAR | Status: AC
  Administered 2022-02-12: 50 ug via INTRAVENOUS
  Filled 2022-02-12: qty 1

## 2022-02-12 MED ORDER — FENTANYL CITRATE PF 50 MCG/ML IJ SOSY: 50.0000 ug | PREFILLED_SYRINGE | Freq: Once | INTRAMUSCULAR | Status: AC

## 2022-02-12 MED ORDER — ETOMIDATE 2 MG/ML IV SOLN
10.0000 mg | Freq: Once | INTRAVENOUS | Status: AC
  Administered 2022-02-12: 10 mg via INTRAVENOUS
  Filled 2022-02-12: qty 10

## 2022-02-12 MED ORDER — SODIUM CHLORIDE 0.9 % IV SOLN: INTRAVENOUS | Status: DC

## 2022-02-12 MED ORDER — HYDROMORPHONE HCL 1 MG/ML IJ SOLN
1.0000 mg | Freq: Once | INTRAMUSCULAR | Status: AC
  Administered 2022-02-12: 1 mg via INTRAVENOUS
  Filled 2022-02-12: qty 1

## 2022-02-12 MED ORDER — ONDANSETRON HCL 4 MG/2ML IJ SOLN
4.0000 mg | Freq: Once | INTRAMUSCULAR | Status: AC
  Administered 2022-02-12: 4 mg via INTRAVENOUS
  Filled 2022-02-12: qty 2

## 2022-02-12 NOTE — ED Provider Notes (Signed)
[facility] EMERGENCY DEPARTMENT Provider Note   CSN: [id number] Arrival date & time: 02/12/22  1132     History  Chief Complaint  Patient presents with   Shoulder Injury    DIEM PAGNOTTA is a 36 y.o. male.  Patient with a history of frequent shoulder dislocations.  Last time was several months ago.  As it came out again today while trying to open the car door about an hour prior to arrival.  This is about the 12 time that its been out.  Patient has been advised in the past to follow-up with orthopedics for permanent repair but has not been able to make that happen yet.  Has medical history significant for narcotic abuse.       Home Medications Prior to Admission medications   Not on File      Allergies    Patient has no known allergies.    Review of Systems   Review of Systems  Constitutional:  Negative for chills and fever.  HENT:  Negative for ear pain and sore throat.   Eyes:  Negative for pain and visual disturbance.  Respiratory:  Negative for cough and shortness of breath.   Cardiovascular:  Negative for chest pain and palpitations.  Gastrointestinal:  Negative for abdominal pain and vomiting.  Genitourinary:  Negative for dysuria and hematuria.  Musculoskeletal:  Negative for arthralgias and back pain.  Skin:  Negative for color change and rash.  Neurological:  Negative for seizures and syncope.  All other systems reviewed and are negative.   Physical Exam Updated Vital Signs BP 125/70   Pulse 61   Temp (!) 97.5 F (36.4 C) (Oral)   Resp (!) 9   Ht 1.803 m (5\' 11" )   Wt 79.4 kg   SpO2 100%   BMI 24.41 kg/m  Physical Exam Vitals and nursing note reviewed.  Constitutional:      General: He is not in acute distress.    Appearance: Normal appearance. He is well-developed.  HENT:     Head: Normocephalic and atraumatic.  Eyes:     Extraocular Movements: Extraocular movements intact.     Conjunctiva/sclera: Conjunctivae normal.     Pupils:  Pupils are equal, round, and reactive to light.  Cardiovascular:     Rate and Rhythm: Normal rate and regular rhythm.     Heart sounds: No murmur heard. Pulmonary:     Effort: Pulmonary effort is normal. No respiratory distress.     Breath sounds: Normal breath sounds.  Abdominal:     Palpations: Abdomen is soft.     Tenderness: There is no abdominal tenderness.  Musculoskeletal:        General: Deformity present. No swelling.     Cervical back: Normal range of motion and neck supple.     Comments: Obvious deformity to the left shoulder.  Radial pulse distally 2+ sensation intact.  No numbness to the upper arm.  Skin:    General: Skin is warm and dry.     Capillary Refill: Capillary refill takes less than 2 seconds.  Neurological:     General: No focal deficit present.     Mental Status: He is alert and oriented to person, place, and time.     Cranial Nerves: No cranial nerve deficit.     Sensory: No sensory deficit.     Motor: No weakness.  Psychiatric:        Mood and Affect: Mood normal.     ED Results /  Procedures / Treatments   Labs (all labs ordered are listed, but only abnormal results are displayed) Labs Reviewed - No data to display  EKG None  Radiology DG Shoulder Left Portable  Result Date: 02/12/2022 CLINICAL DATA:  Postreduction images of left shoulder EXAM: LEFT SHOULDER COMPARISON:  Study done earlier today FINDINGS: There is interval reduction of anterior dislocation. No fracture lines are seen. IMPRESSION: There is satisfactory interval reduction of anterior dislocation of left shoulder. No fracture lines are seen. Electronically Signed   By: Ernie Avena M.D.   On: 02/12/2022 14:10   DG Shoulder Left Portable  Result Date: 02/12/2022 CLINICAL DATA:  Pain and dislocation. EXAM: LEFT SHOULDER COMPARISON:  None Available. FINDINGS: There is an anterior dislocation of the shoulder. No fractures. No other abnormalities. IMPRESSION: Anterior dislocation of  the shoulder. Electronically Signed   By: Gerome Sam III M.D.   On: 02/12/2022 12:08    Procedures .Sedation  Date/Time: 02/12/2022 2:21 PM  Performed by: Vanetta Mulders, MD Authorized by: Vanetta Mulders, MD   Consent:    Consent obtained:  Written   Consent given by:  Patient   Risks discussed:  Allergic reaction, prolonged hypoxia resulting in organ damage, dysrhythmia, prolonged sedation necessitating reversal, inadequate sedation, respiratory compromise necessitating ventilatory assistance and intubation, nausea and vomiting   Alternatives discussed:  Analgesia without sedation Universal protocol:    Procedure explained and questions answered to patient or proxy's satisfaction: yes     Relevant documents present and verified: yes     Test results available: no     Imaging studies available: yes     Site/side marked: no     Immediately prior to procedure, a time out was called: yes     Patient identity confirmed:  Verbally with patient Indications:    Procedure performed:  Dislocation reduction Pre-sedation assessment:    Time since last food or drink:  3 hours ago   ASA classification: class 1 - normal, healthy patient     Mouth opening:  3 or more finger widths   Mallampati score:  I - soft palate, uvula, fauces, pillars visible   Neck mobility: normal     Pre-sedation assessments completed and reviewed: airway patency, cardiovascular function, mental status, nausea/vomiting, pain level and respiratory function     Pre-sedation assessment completed:  02/12/2022 2:23 PM Immediate pre-procedure details:    Reviewed: vital signs     Verified: bag valve mask available, emergency equipment available, intubation equipment available, IV patency confirmed, oxygen available and suction available   Procedure details (see MAR for exact dosages):    Preoxygenation:  Nasal cannula   Sedation:  Etomidate   Intended level of sedation: deep   Analgesia:  Hydromorphone    Intra-procedure monitoring:  Blood pressure monitoring, continuous capnometry, frequent LOC assessments, cardiac monitor, continuous pulse oximetry and frequent vital sign checks   Intra-procedure events: none     Total Provider sedation time (minutes):  20 Post-procedure details:    Attendance: Constant attendance by certified staff until patient recovered     Recovery: Patient returned to pre-procedure baseline     Post-sedation assessments completed and reviewed: airway patency, cardiovascular function, mental status, nausea/vomiting, pain level and respiratory function     Patient is stable for discharge or admission: yes     Procedure completion:  Tolerated well, no immediate complications     Medications Ordered in ED Medications  0.9 %  sodium chloride infusion ( Intravenous New Bag/Given 02/12/22 1233)  fentaNYL (SUBLIMAZE) injection 50 mcg (50 mcg Intravenous Given 02/12/22 1155)  HYDROmorphone (DILAUDID) injection 1 mg (1 mg Intravenous Given 02/12/22 1232)  ondansetron (ZOFRAN) injection 4 mg (4 mg Intravenous Given 02/12/22 1232)  etomidate (AMIDATE) injection 10 mg (10 mg Intravenous Given 02/12/22 1340)  HYDROmorphone (DILAUDID) injection 1 mg (1 mg Intravenous Given 02/12/22 1321)    ED Course/ Medical Decision Making/ A&P                           Medical Decision Making Amount and/or Complexity of Data Reviewed Radiology: ordered.  Risk Prescription drug management.   Patient sedated with etomidate this time worked extremely well.  Reduction done by my physician assistant Tammy her note is separate.  Easily reduced.  Shoulder immobilizer placed.  Patient feeling much better.  Recommending close follow-up with orthopedics for permanent fix since this is a 12 time that the shoulder has been dislocated.   Final Clinical Impression(s) / ED Diagnoses Final diagnoses:  Shoulder dislocation, left, initial encounter    Rx / DC Orders ED Discharge Orders     None          Vanetta Mulders, MD 02/12/22 1425

## 2022-02-12 NOTE — ED Notes (Addendum)
EDP at [facility] discussing results and plan. Consent signed for consious sedation and shoulder reduction

## 2022-02-12 NOTE — ED Notes (Addendum)
Xray at [facility]. Pt resting in stretcher upright, guarding L arm movements

## 2022-02-12 NOTE — ED Provider Notes (Signed)
I assisted Dr. Darlyn Chamber in reduction of this patient's left shoulder injury.  This was my only involvement in the patient's care.      Reduction of dislocation  Date/Time: 02/12/2022 1:54 PM  Performed by: Pauline Aus, PA-C Authorized by: Pauline Aus, PA-C  Consent given by: patient Patient understanding: patient states understanding of the procedure being performed Site marked: the operative site was marked Imaging studies: imaging studies available Patient identity confirmed: verbally with patient and arm band Time out: Immediately prior to procedure a "time out" was called to verify the correct patient, procedure, equipment, support staff and site/side marked as required. Local anesthesia used: no  Anesthesia: Local anesthesia used: no Patient tolerance: patient tolerated the procedure well with no immediate complications Comments:   See Dr. Darlyn Chamber note for procedural sedation  Postreduction film of the left shoulder shows successful reduction       Pauline Aus, PA-C 02/12/22 1410    Vanetta Mulders, MD 02/12/22 1418

## 2022-02-12 NOTE — ED Triage Notes (Signed)
Pt to er room number 2, pt staes that he dislocated his shoulder about 15 minutes ago, states that he has done this before in the past.  Pt has palpable pedal pulse, pt c/o L shoulder pain

## 2022-02-12 NOTE — ED Notes (Signed)
Post reduction film complete. Pending results. Pt speaking with family at [facility].

## 2022-02-12 NOTE — Discharge Instructions (Addendum)
Keep shoulder immobilizer on and in place until followed up by orthopedics.  If you are having trouble getting seen by orthopedics locally could go to the [facility] emergency department for follow-up of the persistent shoulder dislocation syncopal again to the clinic.

## 2022-11-28 IMAGING — DX DG SHOULDER 1V*L*
2 series · 2 of 2 positions shown · non-contrast
Comparison: None.

CLINICAL DATA: Possible shoulder dislocation.

EXAM:
LEFT SHOULDER

[shoulder ap]
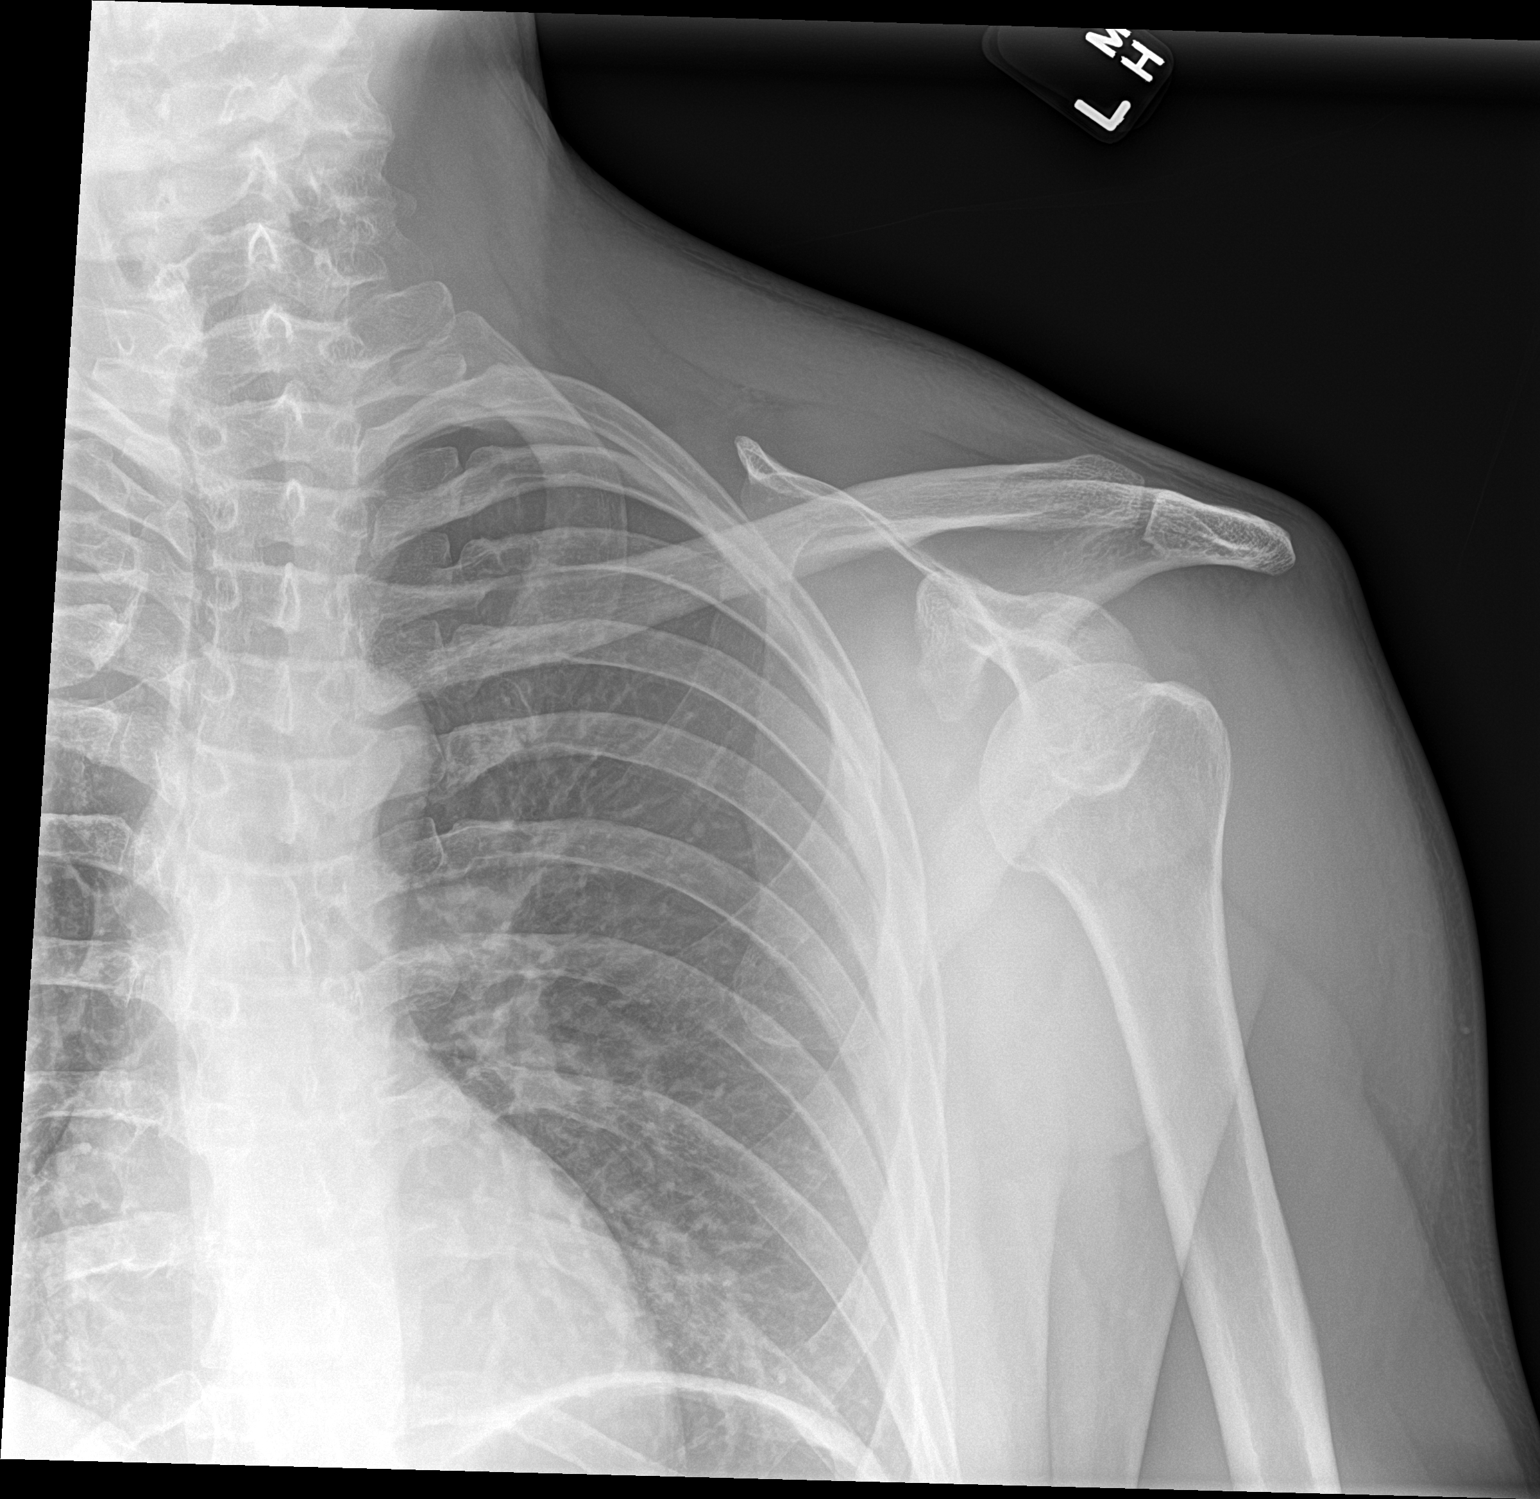

[shoulder y view]
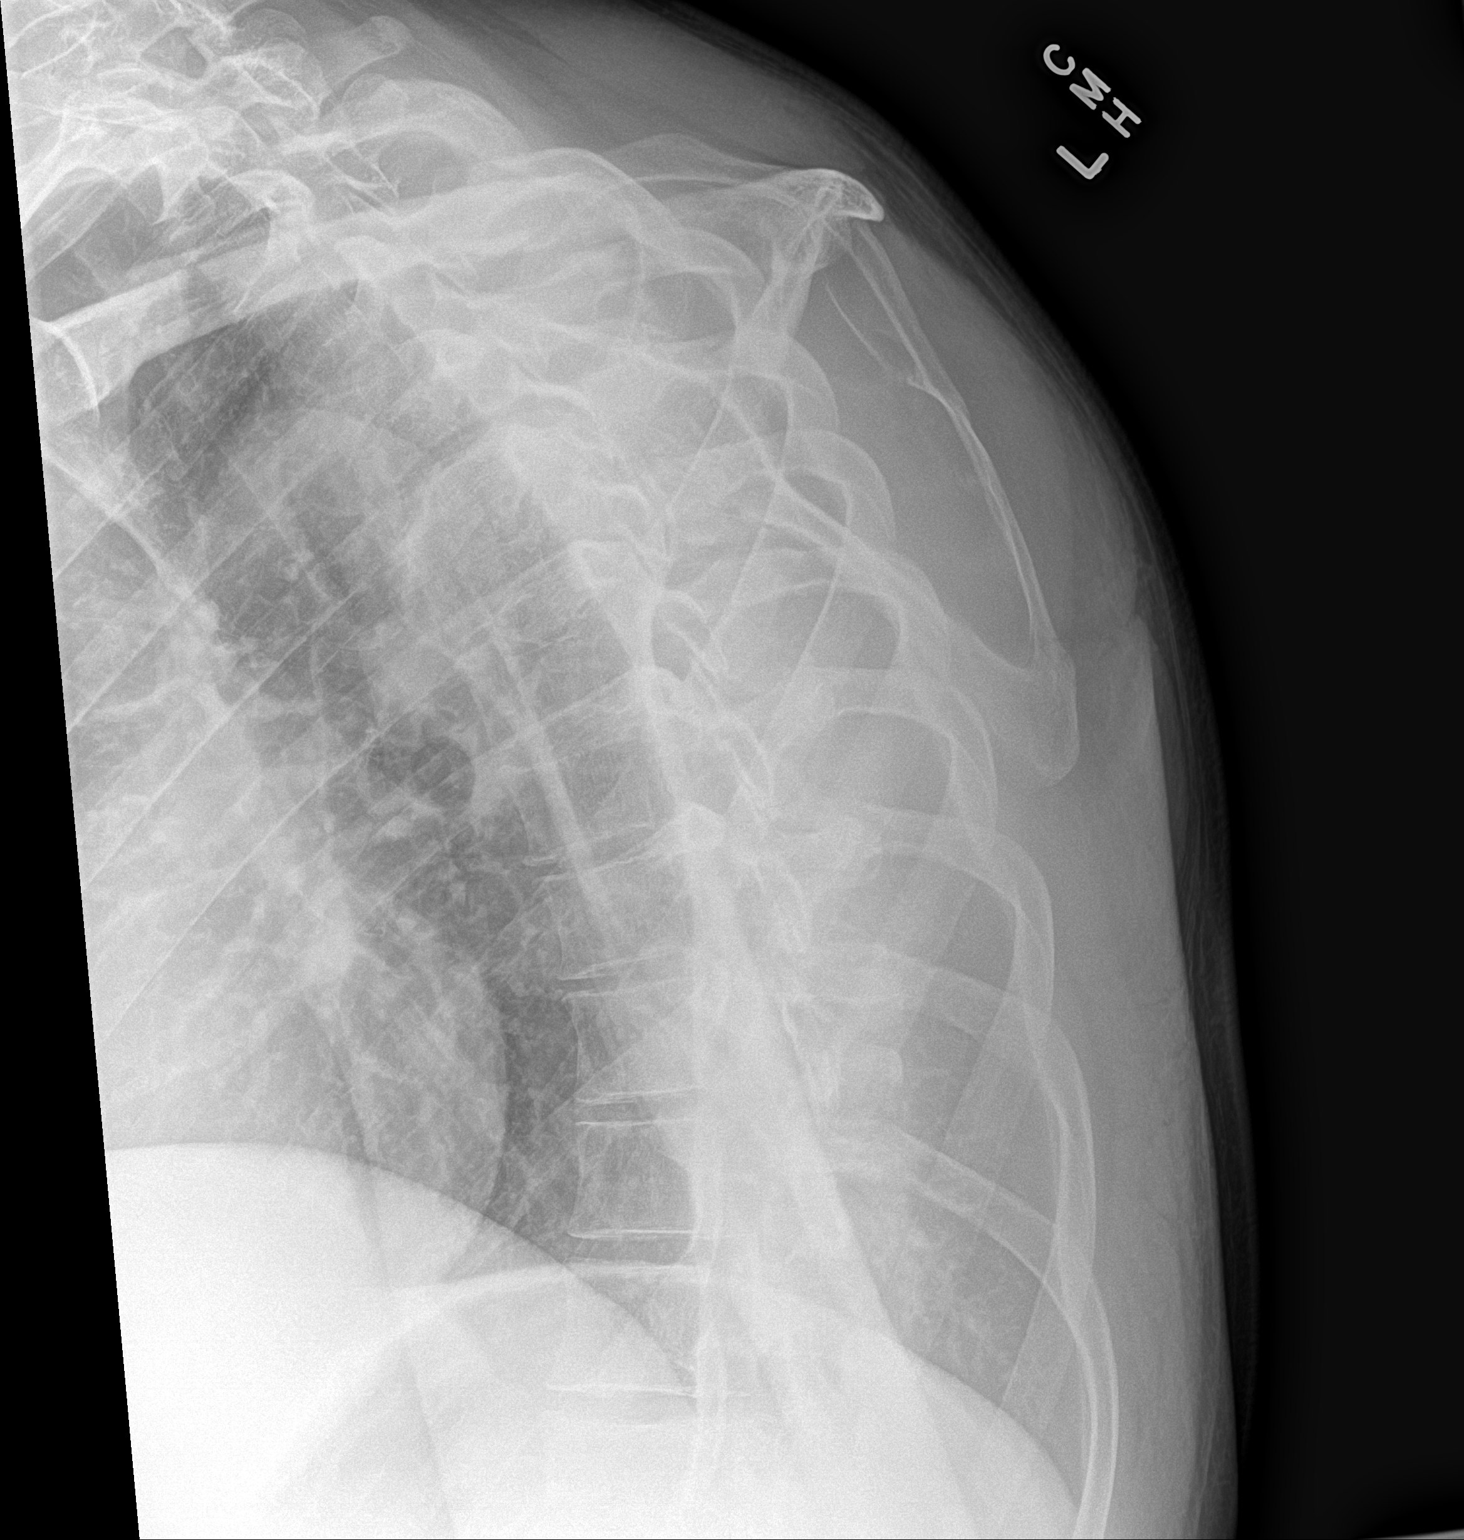

[2 of 2 positions shown; findings below may reference images not displayed]

FINDINGS: There appears to be anterior dislocation of the left humeral head.
No visible fracture. AC joint is intact. Soft tissues are intact.
IMPRESSION: Anterior left shoulder dislocation.

## 2023-02-12 DIAGNOSIS — Z419 Encounter for procedure for purposes other than remedying health state, unspecified: Secondary | ICD-10-CM | POA: Diagnosis not present

## 2023-03-03 ENCOUNTER — Emergency Department (HOSPITAL_COMMUNITY)
Admission: EM | Admit: 2023-03-03 | Discharge: 2023-03-03 | Disposition: A | Discharge status: Home / Self Care | Payer: Medicaid Other | Attending: Emergency Medicine | Admitting: Emergency Medicine

## 2023-03-03 ENCOUNTER — Other Ambulatory Visit: Payer: Self-pay

## 2023-03-03 ENCOUNTER — Encounter (HOSPITAL_COMMUNITY): Payer: Self-pay

## 2023-03-03 DIAGNOSIS — Z743 Need for continuous supervision: Secondary | ICD-10-CM | POA: Diagnosis not present

## 2023-03-03 DIAGNOSIS — R404 Transient alteration of awareness: Secondary | ICD-10-CM | POA: Diagnosis not present

## 2023-03-03 DIAGNOSIS — R0689 Other abnormalities of breathing: Secondary | ICD-10-CM | POA: Diagnosis not present

## 2023-03-03 DIAGNOSIS — R6889 Other general symptoms and signs: Secondary | ICD-10-CM | POA: Diagnosis not present

## 2023-03-03 DIAGNOSIS — T401X1A Poisoning by heroin, accidental (unintentional), initial encounter: Secondary | ICD-10-CM | POA: Insufficient documentation

## 2023-03-03 DIAGNOSIS — R000 Tachycardia, unspecified: Secondary | ICD-10-CM | POA: Diagnosis not present

## 2023-03-03 LAB — CBC WITH DIFFERENTIAL/PLATELET
Abs Immature Granulocytes: 0.02 10*3/uL (ref 0.00–0.07)
Basophils Absolute: 0.1 10*3/uL (ref 0.0–0.1)
Basophils Relative: 1 %
Eosinophils Absolute: 0.5 10*3/uL (ref 0.0–0.5)
Eosinophils Relative: 7 %
HCT: 39.2 % (ref 39.0–52.0)
Hemoglobin: 13.1 g/dL (ref 13.0–17.0)
Immature Granulocytes: 0 %
Lymphocytes Relative: 32 %
Lymphs Abs: 2.3 10*3/uL (ref 0.7–4.0)
MCH: 30.7 pg (ref 26.0–34.0)
MCHC: 33.4 g/dL (ref 30.0–36.0)
MCV: 91.8 fL (ref 80.0–100.0)
Monocytes Absolute: 0.7 10*3/uL (ref 0.1–1.0)
Monocytes Relative: 10 %
Neutro Abs: 3.6 10*3/uL (ref 1.7–7.7)
Neutrophils Relative %: 50 %
Platelets: 245 10*3/uL (ref 150–400)
RBC: 4.27 MIL/uL (ref 4.22–5.81)
RDW: 12.9 % (ref 11.5–15.5)
WBC: 7.2 10*3/uL (ref 4.0–10.5)
nRBC: 0 % (ref 0.0–0.2)

## 2023-03-03 LAB — RAPID URINE DRUG SCREEN, HOSP PERFORMED
Amphetamines: NOT DETECTED
Barbiturates: NOT DETECTED
Benzodiazepines: NOT DETECTED
Cocaine: NOT DETECTED
Opiates: POSITIVE — AB
Tetrahydrocannabinol: NOT DETECTED

## 2023-03-03 LAB — BASIC METABOLIC PANEL
Anion gap: 10 (ref 5–15)
BUN: 14 mg/dL (ref 6–20)
CO2: 24 mmol/L (ref 22–32)
Calcium: 8.6 mg/dL — ABNORMAL LOW (ref 8.9–10.3)
Chloride: 101 mmol/L (ref 98–111)
Creatinine, Ser: 0.91 mg/dL (ref 0.61–1.24)
GFR, Estimated: >60 mL/min (ref >60)
Glucose, Bld: 144 mg/dL — ABNORMAL HIGH (ref 70–99)
Potassium: 3.5 mmol/L (ref 3.5–5.1)
Sodium: 135 mmol/L (ref 135–145)

## 2023-03-03 NOTE — Discharge Instructions (Signed)
Follow-up with [facility] to discuss your drug use

## 2023-03-03 NOTE — ED Notes (Signed)
ED Provider at bedside. 

## 2023-03-03 NOTE — ED Triage Notes (Signed)
Pt in [facility] and thought he took fentanyl in nostril. Medical illustrator. Got there before [facility]. [facility] gave 1 mg of narcan IV and pt became aroused. Pt aroused , alert at this time. 16 G RAC.

## 2023-03-03 NOTE — ED Notes (Signed)
Pt given water per MD

## 2023-03-04 NOTE — ED Provider Notes (Signed)
[place] EMERGENCY DEPARTMENT AT [facility] Provider Note   CSN: [id number] Arrival date & time: 03/03/23  1748     History  Chief Complaint  Patient presents with   Drug Overdose    Wesley Stephens is a 37 y.o. male.  Patient was found at a restaurant unresponsive.  When paramedics arrived they gave him some Narcan and he awoke.  Patient admits to using opiates.  He has no other medical complaints or problems  The history is provided by the patient and medical records. No language interpreter was used.  Drug Overdose This is a new problem. The current episode started less than 1 hour ago. The problem occurs rarely. The problem has been resolved. Pertinent negatives include no chest pain, no abdominal pain and no headaches. Nothing aggravates the symptoms. Nothing relieves the symptoms. He has tried nothing for the symptoms. The treatment provided no relief.       Home Medications Prior to Admission medications   Not on File      Allergies    Patient has no known allergies.    Review of Systems   Review of Systems  Constitutional:  Negative for appetite change and fatigue.  HENT:  Negative for congestion, ear discharge and sinus pressure.   Eyes:  Negative for discharge.  Respiratory:  Negative for cough.   Cardiovascular:  Negative for chest pain.  Gastrointestinal:  Negative for abdominal pain and diarrhea.  Genitourinary:  Negative for frequency and hematuria.  Musculoskeletal:  Negative for back pain.  Skin:  Negative for rash.  Neurological:  Negative for seizures and headaches.  Psychiatric/Behavioral:  Negative for hallucinations.     Physical Exam Updated Vital Signs BP 126/80   Pulse 88   Temp 98.4 F (36.9 C) (Oral)   Resp 14   Ht 5\' 10"  (1.778 m)   Wt 81.6 kg   SpO2 97%   BMI 25.83 kg/m  Physical Exam Vitals and nursing note reviewed.  Constitutional:      Appearance: He is well-developed.  HENT:     Head: Normocephalic.      Nose: Nose normal.  Eyes:     General: No scleral icterus.    Conjunctiva/sclera: Conjunctivae normal.  Neck:     Thyroid: No thyromegaly.  Cardiovascular:     Rate and Rhythm: Normal rate and regular rhythm.     Heart sounds: No murmur heard.    No friction rub. No gallop.  Pulmonary:     Breath sounds: No stridor. No wheezing or rales.  Chest:     Chest wall: No tenderness.  Abdominal:     General: There is no distension.     Tenderness: There is no abdominal tenderness. There is no rebound.  Musculoskeletal:        General: Normal range of motion.     Cervical back: Neck supple.  Lymphadenopathy:     Cervical: No cervical adenopathy.  Skin:    Findings: No erythema or rash.  Neurological:     Mental Status: He is alert and oriented to person, place, and time.     Motor: No abnormal muscle tone.     Coordination: Coordination normal.  Psychiatric:        Behavior: Behavior normal.     ED Results / Procedures / Treatments   Labs (all labs ordered are listed, but only abnormal results are displayed) Labs Reviewed  BASIC METABOLIC PANEL - Abnormal; Notable for the following components:  Result Value   Glucose, Bld 144 (*)    Calcium 8.6 (*)    All other components within normal limits  RAPID URINE DRUG SCREEN, HOSP PERFORMED - Abnormal; Notable for the following components:   Opiates POSITIVE (*)    All other components within normal limits  CBC WITH DIFFERENTIAL/PLATELET    EKG None  Radiology No results found.  Procedures Procedures    Medications Ordered in ED Medications - No data to display  ED Course/ Medical Decision Making/ A&P                                 Medical Decision Making Amount and/or Complexity of Data Reviewed Labs: ordered.   Patient with opiate overdose.  He responded to Narcan.  His exam was normal.  Patient was observed for nearly 5 hours without any relapse.  He is discharged home and referred to outpatient  substance abuse place        Final Clinical Impression(s) / ED Diagnoses Final diagnoses:  Accidental overdose of heroin, initial encounter [facility])    Rx / DC Orders ED Discharge Orders     None         Bethann Berkshire, MD 03/04/23 1205

## 2023-03-10 ENCOUNTER — Emergency Department (HOSPITAL_COMMUNITY)
Admission: EM | Admit: 2023-03-10 | Discharge: 2023-03-11 | Disposition: A | Discharge status: Home / Self Care | Payer: Medicaid Other | Attending: Emergency Medicine | Admitting: Emergency Medicine

## 2023-03-10 ENCOUNTER — Encounter (HOSPITAL_COMMUNITY): Payer: Self-pay | Admitting: *Deleted

## 2023-03-10 ENCOUNTER — Other Ambulatory Visit: Payer: Self-pay

## 2023-03-10 DIAGNOSIS — Z0279 Encounter for issue of other medical certificate: Secondary | ICD-10-CM | POA: Insufficient documentation

## 2023-03-10 DIAGNOSIS — F1120 Opioid dependence, uncomplicated: Secondary | ICD-10-CM

## 2023-03-10 NOTE — ED Triage Notes (Signed)
Pt seen at [facility] Wednesday and admitted and released today, pt wants to detox from methadone but has to be medically cleared for detox facility. Pt denies any SI or HI.  Denies any ETOH and drug use.

## 2023-03-11 LAB — CBC WITH DIFFERENTIAL/PLATELET
Abs Immature Granulocytes: 0.04 10*3/uL (ref 0.00–0.07)
Basophils Absolute: 0 10*3/uL (ref 0.0–0.1)
Basophils Relative: 0 %
Eosinophils Absolute: 0.2 10*3/uL (ref 0.0–0.5)
Eosinophils Relative: 2 %
HCT: 41 % (ref 39.0–52.0)
Hemoglobin: 13.6 g/dL (ref 13.0–17.0)
Immature Granulocytes: 0 %
Lymphocytes Relative: 21 %
Lymphs Abs: 1.9 10*3/uL (ref 0.7–4.0)
MCH: 30.4 pg (ref 26.0–34.0)
MCHC: 33.2 g/dL (ref 30.0–36.0)
MCV: 91.5 fL (ref 80.0–100.0)
Monocytes Absolute: 0.8 10*3/uL (ref 0.1–1.0)
Monocytes Relative: 9 %
Neutro Abs: 6.2 10*3/uL (ref 1.7–7.7)
Neutrophils Relative %: 68 %
Platelets: 258 10*3/uL (ref 150–400)
RBC: 4.48 MIL/uL (ref 4.22–5.81)
RDW: 13 % (ref 11.5–15.5)
WBC: 9.2 10*3/uL (ref 4.0–10.5)
nRBC: 0 % (ref 0.0–0.2)

## 2023-03-11 LAB — COMPREHENSIVE METABOLIC PANEL
ALT: 50 U/L — ABNORMAL HIGH (ref 0–44)
AST: 30 U/L (ref 15–41)
Albumin: 4 g/dL (ref 3.5–5.0)
Alkaline Phosphatase: 87 U/L (ref 38–126)
Anion gap: 9 (ref 5–15)
BUN: 17 mg/dL (ref 6–20)
CO2: 24 mmol/L (ref 22–32)
Calcium: 9 mg/dL (ref 8.9–10.3)
Chloride: 104 mmol/L (ref 98–111)
Creatinine, Ser: 1.17 mg/dL (ref 0.61–1.24)
GFR, Estimated: >60 mL/min (ref >60)
Glucose, Bld: 109 mg/dL — ABNORMAL HIGH (ref 70–99)
Potassium: 3.6 mmol/L (ref 3.5–5.1)
Sodium: 137 mmol/L (ref 135–145)
Total Bilirubin: 0.4 mg/dL (ref 0.3–1.2)
Total Protein: 7 g/dL (ref 6.5–8.1)

## 2023-03-11 LAB — RAPID URINE DRUG SCREEN, HOSP PERFORMED
Amphetamines: NOT DETECTED
Barbiturates: NOT DETECTED
Benzodiazepines: POSITIVE — AB
Cocaine: NOT DETECTED
Opiates: NOT DETECTED
Tetrahydrocannabinol: NOT DETECTED

## 2023-03-11 LAB — ETHANOL: Alcohol, Ethyl (B): <10 mg/dL (ref <10)

## 2023-03-11 NOTE — ED Notes (Signed)
ED Provider at bedside. 

## 2023-03-11 NOTE — ED Notes (Signed)
Pt reports recently being d/c from [facility]. Pt reports he needs medical clearance from the hospital to return to [facility] to continue Detox process per his [facility] Officers request.

## 2023-03-11 NOTE — Discharge Instructions (Signed)
Wesley Stephens has been seen and evaluated in the emergency department at [facility] and is medically cleared for substance abuse treatment.

## 2023-03-11 NOTE — ED Provider Notes (Signed)
[place] EMERGENCY DEPARTMENT AT [facility] Provider Note   CSN: [id number] Arrival date & time: 03/10/23  2001     History  No chief complaint on file.   Wesley Stephens is a 37 y.o. male.  Patient is a 37 year old male with past medical history of polysubstance abuse.  Patient presents today requesting medical clearance for detox.  He tells me he has been using methadone which she has been purchasing off the street.  His last use was on Tuesday.  He tells me he checked into [facility] and was there for 48 hours, however needed medical clearance in order to get into a 7-day rehab.  Patient does not specify what medical clearance consists the facility is requesting.  The history is provided by the patient.       Home Medications Prior to Admission medications   Not on File      Allergies    Patient has no known allergies.    Review of Systems   Review of Systems  All other systems reviewed and are negative.   Physical Exam Updated Vital Signs BP 131/85 (BP Location: Right Arm)   Pulse (!) 112   Temp 98 F (36.7 C) (Oral)   Resp 16   Ht 5\' 10"  (1.778 m)   Wt 81.6 kg   SpO2 100%   BMI 25.83 kg/m  Physical Exam Vitals and nursing note reviewed.  Constitutional:      General: He is not in acute distress.    Appearance: He is well-developed. He is not diaphoretic.  HENT:     Head: Normocephalic and atraumatic.  Cardiovascular:     Rate and Rhythm: Normal rate and regular rhythm.     Heart sounds: No murmur heard.    No friction rub.  Pulmonary:     Effort: Pulmonary effort is normal. No respiratory distress.     Breath sounds: Normal breath sounds. No wheezing or rales.  Abdominal:     General: Bowel sounds are normal. There is no distension.     Palpations: Abdomen is soft.     Tenderness: There is no abdominal tenderness.  Musculoskeletal:        General: Normal range of motion.     Cervical back: Normal range of motion and neck supple.   Skin:    General: Skin is warm and dry.  Neurological:     Mental Status: He is alert and oriented to person, place, and time.     Coordination: Coordination normal.     ED Results / Procedures / Treatments   Labs (all labs ordered are listed, but only abnormal results are displayed) Labs Reviewed  COMPREHENSIVE METABOLIC PANEL  CBC WITH DIFFERENTIAL/PLATELET  ETHANOL  RAPID URINE DRUG SCREEN, HOSP PERFORMED    EKG None  Radiology No results found.  Procedures Procedures    Medications Ordered in ED Medications - No data to display  ED Course/ Medical Decision Making/ A&P  Patient sent here from [facility] for "medical clearance" for 7-day inpatient detox.  Patient tells me he has not used methadone in 3 days and reports feeling mild withdrawal symptoms.  Patient is clinically well-appearing and in no distress.  His vital signs are stable.  Laboratory studies obtained including CBC and CMP, both of which are unremarkable.  Urine drug screen is positive for benzodiazepines but otherwise negative.  Serum EtOH is undetectable.  At this point, patient has stable vital signs, basically unremarkable laboratory studies, and reassuring physical examination.  I feel as though he is medically cleared for substance abuse treatment.  Final Clinical Impression(s) / ED Diagnoses Final diagnoses:  None    Rx / DC Orders ED Discharge Orders     None         Geoffery Lyons, MD 03/11/23 [phone]

## 2023-03-14 DIAGNOSIS — Z419 Encounter for procedure for purposes other than remedying health state, unspecified: Secondary | ICD-10-CM | POA: Diagnosis not present

## 2023-03-22 DIAGNOSIS — T887XXA Unspecified adverse effect of drug or medicament, initial encounter: Secondary | ICD-10-CM | POA: Diagnosis not present

## 2023-03-22 DIAGNOSIS — Z741 Need for assistance with personal care: Secondary | ICD-10-CM | POA: Diagnosis not present

## 2023-03-22 DIAGNOSIS — T50904A Poisoning by unspecified drugs, medicaments and biological substances, undetermined, initial encounter: Secondary | ICD-10-CM | POA: Diagnosis not present

## 2023-03-22 DIAGNOSIS — R404 Transient alteration of awareness: Secondary | ICD-10-CM | POA: Diagnosis not present

## 2023-07-26 ENCOUNTER — Emergency Department (HOSPITAL_COMMUNITY)
Admission: EM | Admit: 2023-07-26 | Discharge: 2023-07-27 | Disposition: A | Discharge status: Home / Self Care | Payer: MEDICAID | Attending: Emergency Medicine | Admitting: Emergency Medicine

## 2023-07-26 ENCOUNTER — Other Ambulatory Visit: Payer: Self-pay

## 2023-07-26 ENCOUNTER — Encounter (HOSPITAL_COMMUNITY): Payer: Self-pay

## 2023-07-26 DIAGNOSIS — K0889 Other specified disorders of teeth and supporting structures: Secondary | ICD-10-CM | POA: Insufficient documentation

## 2023-07-26 DIAGNOSIS — R509 Fever, unspecified: Secondary | ICD-10-CM | POA: Diagnosis not present

## 2023-07-26 DIAGNOSIS — R112 Nausea with vomiting, unspecified: Secondary | ICD-10-CM | POA: Diagnosis present

## 2023-07-26 DIAGNOSIS — R197 Diarrhea, unspecified: Secondary | ICD-10-CM | POA: Insufficient documentation

## 2023-07-26 HISTORY — DX: Opioid abuse, uncomplicated: F11.10

## 2023-07-26 NOTE — ED Triage Notes (Signed)
Pt to ED from home with c/o emesis, low energy, and not feeling good, also c/o abscess tooth for the past week.

## 2023-07-27 MED ORDER — ONDANSETRON 4 MG PO TBDP: 4.0000 mg | ORAL_TABLET | Freq: Three times a day (TID) | ORAL | 0 refills | Status: AC | PRN

## 2023-07-27 MED ORDER — AMOXICILLIN 500 MG PO CAPS: 1000.0000 mg | ORAL_CAPSULE | Freq: Two times a day (BID) | ORAL | 0 refills | Status: AC

## 2023-07-27 MED ORDER — AMOXICILLIN 250 MG PO CAPS
1000.0000 mg | ORAL_CAPSULE | Freq: Once | ORAL | Status: AC
  Administered 2023-07-27: 1000 mg via ORAL
  Filled 2023-07-27: qty 4

## 2023-07-27 NOTE — ED Provider Notes (Signed)
[place] EMERGENCY DEPARTMENT AT [facility] Provider Note   CSN: [id number] Arrival date & time: 07/26/23  2043     History  Chief Complaint  Patient presents with   Emesis    Wesley Stephens is a 38 y.o. male.  The history is provided by the patient.  Emesis He has history of narcotic abuse and comes in complaining of vomiting and diarrhea.  Symptoms started about 2 days ago, he has not had any diarrhea today.  Nausea has subsided and he has been able to tolerate fluids tonight.  He had subjective fever at home without chills or sweats.  He denies any sick contacts or suspicious food intake.  As a second complaint, he has had recurrent dental pain on the left lower side and thinks he may have an abscess.  He has periodically been on antibiotics but has resisted seeing a dentist.   Home Medications Prior to Admission medications   Not on File      Allergies    Patient has no known allergies.    Review of Systems   Review of Systems  Gastrointestinal:  Positive for vomiting.  All other systems reviewed and are negative.   Physical Exam Updated Vital Signs BP (!) 142/81 (BP Location: Right Arm)   Pulse (!) 113   Temp 98 F (36.7 C) (Oral)   Resp 18   Ht 5\' 10"  (1.778 m)   Wt 81.6 kg   SpO2 100%   BMI 25.81 kg/m  Physical Exam Vitals and nursing note reviewed.   38 year old male, resting comfortably and in no acute distress. Vital signs are significant for mildly elevated blood pressure and mildly elevated heart rate. Oxygen saturation is 100%, which is normal. Head is normocephalic and atraumatic. PERRLA, EOMI. Oropharynx shows generally poor dentition.  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 Neck is nontender and supple without adenopathy. Lungs are clear without rales, wheezes, or rhonchi. Chest is nontender. Heart has regular rate and rhythm without murmur. Abdomen is soft, flat, nontender Neurologic: Mental status is normal,  moves all extremities equally.  ED Results / Procedures / Treatments    Procedures Procedures    Medications Ordered in ED Medications  amoxicillin (AMOXIL) capsule 1,000 mg (has no administration in time range)    ED Course/ Medical Decision Making/ A&P                                 Medical Decision Making  Nausea, vomiting, diarrhea in the pattern most consistent with viral gastroenteritis.  Symptoms of resolved at this point.  Dental pain with likely dental abscess.  I have ordered a dose of amoxicillin and I am discharging with a prescription for amoxicillin and I have instructed him to make sure to follow-up with a dentist.  Advised that if his tooth starts feeling better, it does not mean that it is fixed.  He will need evaluation and treatment by dentist to actually fix his dental problem.  I am discharging him with a prescription for ondansetron oral dissolving tablet, advised to use over-the-counter loperamide as needed for diarrhea.  Final Clinical Impression(s) / ED Diagnoses Final diagnoses:  Nausea vomiting and diarrhea  Pain, dental    Rx / DC Orders ED Discharge Orders          Ordered    amoxicillin (AMOXIL) 500 MG capsule  2 times  daily        07/27/23 0212    ondansetron (ZOFRAN-ODT) 4 MG disintegrating tablet  Every 8 hours PRN        07/27/23 5621              Dione Booze, MD 07/27/23 [phone]

## 2023-07-27 NOTE — Discharge Instructions (Addendum)
It is very important that she see a dentist to extract the teeth which are seriously rotted.  They will not get better without treatment by a dentist.  You may take loperamide (Imodium A-D) as needed for diarrhea.
